# Patient Record
Sex: Female | Born: 1955 | Race: White | Hispanic: No | Marital: Married
Health system: Southern US, Community
[De-identification: ages and names within clinical notes are randomized; demographics above are authoritative.]

---

## 2003-04-13 ENCOUNTER — Encounter: Payer: Self-pay | Admitting: Neurosurgery

## 2003-04-13 ENCOUNTER — Inpatient Hospital Stay (HOSPITAL_COMMUNITY): Admission: RE | Admit: 2003-04-13 | Discharge: 2003-04-14 | Payer: Self-pay | Admitting: Neurosurgery

## 2006-09-15 ENCOUNTER — Encounter: Admission: RE | Admit: 2006-09-15 | Discharge: 2006-09-15 | Payer: Self-pay | Admitting: Neurosurgery

## 2006-10-20 ENCOUNTER — Encounter: Admission: RE | Admit: 2006-10-20 | Discharge: 2006-10-20 | Payer: Self-pay | Admitting: Neurosurgery

## 2007-01-24 ENCOUNTER — Encounter: Admission: RE | Admit: 2007-01-24 | Discharge: 2007-01-24 | Payer: Self-pay | Admitting: Neurosurgery

## 2007-10-03 ENCOUNTER — Ambulatory Visit: Payer: Self-pay | Admitting: Physical Medicine & Rehabilitation

## 2007-10-03 ENCOUNTER — Encounter
Admission: RE | Admit: 2007-10-03 | Discharge: 2007-10-03 | Payer: Self-pay | Admitting: Physical Medicine & Rehabilitation

## 2007-12-07 ENCOUNTER — Ambulatory Visit: Payer: Self-pay | Admitting: Physical Medicine & Rehabilitation

## 2007-12-08 ENCOUNTER — Encounter
Admission: RE | Admit: 2007-12-08 | Discharge: 2007-12-19 | Payer: Self-pay | Admitting: Physical Medicine & Rehabilitation

## 2008-02-06 ENCOUNTER — Encounter: Admission: RE | Admit: 2008-02-06 | Discharge: 2008-02-06 | Payer: Self-pay | Admitting: Anesthesiology

## 2008-02-07 ENCOUNTER — Ambulatory Visit: Payer: Self-pay | Admitting: Anesthesiology

## 2008-06-19 ENCOUNTER — Encounter: Admission: RE | Admit: 2008-06-19 | Discharge: 2008-06-19 | Payer: Self-pay | Admitting: Anesthesiology

## 2008-06-19 ENCOUNTER — Ambulatory Visit: Payer: Self-pay | Admitting: Anesthesiology

## 2008-10-08 ENCOUNTER — Encounter
Admission: RE | Admit: 2008-10-08 | Discharge: 2009-01-06 | Payer: Self-pay | Admitting: Physical Medicine & Rehabilitation

## 2008-10-09 ENCOUNTER — Ambulatory Visit: Payer: Self-pay | Admitting: Physical Medicine & Rehabilitation

## 2008-11-06 ENCOUNTER — Ambulatory Visit: Payer: Self-pay | Admitting: Physical Medicine & Rehabilitation

## 2008-11-20 ENCOUNTER — Ambulatory Visit: Payer: Self-pay | Admitting: Physical Medicine & Rehabilitation

## 2011-05-12 NOTE — Procedures (Signed)
NAME:  Darlene Stone, ACOFF             ACCOUNT NO.:  [id number]   MEDICAL RECORD NO.:  [id number]           PATIENT TYPE:   LOCATION:                                 FACILITY:   PHYSICIAN:  Celene Kras, MD        DATE OF BIRTH:  02-22-1956   DATE OF PROCEDURE:  DATE OF DISCHARGE:                               OPERATIVE REPORT   Misbah [facility].  I evaluated her.  Filled out the history form and 14-point review of systems.  1. Known to me, spondylitic pain, right and left side.  Response times      3+ weeks from last intervention in February.  2. Other modifiable features in health profile discussed.  She      continues to work, but was just laid off.  3. I will go ahead and proceed with cervical facet medial branch      intervention and have her follow up with Dr. Wynn Banker.  We may      consider a second sequential intervention, with considerations of      RF as an option.  The February intervention was followed by a      number of weeks of relief cycling, which is important to understand      the spondylitic pain with added biomechanical stress above and      below surgical fixation site, as diagnostic and therapeutic.  I do      think she had a positive response.   Objectively, diffuse paracervical myofascial discomfort, with positive  cervical decompression test, right and left suprascapular and levator  scapular pain..  Nothing new neurologically.  This is her typical pain,  spondylitic.   IMPRESSION:  Spondylosis, minimal myelopathy.   PLAN:  Cervical facet medial branch intervention C4, C5, C6 and C7,  contributory innervation addressed, right and left side under local  anesthetic.  Predicated further intervention based on need and overall  response.  Questions were answered and discussed in lay terms.  We are  blocking 4 levels to affect 3 with contributory elements at level 4.  She is consented for today's procedure, assessment in the  context of  activities of daily living.  We will follow up with her.   She is consented.   The patient was taken to the operative suite and placed in supine  position after prep and drape in the usual fashion.  Using a 25-gauge  spinal needle, I advanced to the cervical facet at the medial branch C4,  C5, C6, and C7, contributory innervation addressed, right and left side,  under local anesthetic.  Confirmed placement, injected 0.5 mL lidocaine  1% MPF at each level with a total 40 mg Aristocort in divided doses.   She tolerated the procedure well.  No complications from our procedure.  Appropriate recovery.  Discharge instructions given.  She will follow  up.          ______________________________  Celene Kras, MD    HH/MEDQ  D:  06/19/2008 11:06:42  T:  06/20/2008 02:49:49  Job:  [id number]

## 2011-05-12 NOTE — Procedures (Signed)
NAMESHENICA, HOLZHEIMER             ACCOUNT NO.:  [id number]   MEDICAL RECORD NO.:  [id number]          PATIENT TYPE:  REC   LOCATION:  [facility]                          FACILITY:  [facility]   PHYSICIAN:  Celene Kras, MD        DATE OF BIRTH:  06/13/1956   DATE OF PROCEDURE:  DATE OF DISCHARGE:                               OPERATIVE REPORT   [facility].  I have  evaluated her history form and 14 point review of systems.  I had a  conversation about her with Dr. Larna Daughters.  We are going to go on to the  lower cervical segment facet medial branch intervention to attenuate  symptoms described as myofascial, mechanical, with decreased range of  motion, quality of life indices, restorative sleep capacity.  I have  reviewed this in lay terms and I have used models, I discussed treatment  limitations and options, sequential intervention in 2-3 weeks, consider  RF with strong positive outcome predictive element.   1. Maintain contact with Dr. Larna Daughters.   1. We will follow her expectantly.  C6-C7, C8-T1, right and left side      independent needle access points.  We plan to follow along with her      as she has added biomechanical stress described as above and below      surgical fixation site.  Consider the upper segments and predicate      based on the overall response, examine historical features suggest      a mechanical pain.   Objectively, diffuse paracervical suprascapular myofascial, positive  cervical fetal compression test right left.  Suboccipital compression  test positive. Range of motion impaired secondary to pain.  Nothing new  neurologically.  Typical pain mechanical diskogenic.  Spondylosis  without myelopathy.  She is consented for today's procedure.   The patient was taken to the fluoroscopy suite and placed in the supine  position.  Her neck was prepped and draped in the usual fashion. Using a  22 gauge needle, I advanced to the  cervical facet medial branch, C6, C7,  T1/8, right left side independent needle access points under local  anesthetic.  Confirmed placement.  I then inject 0.5 mL lidocaine 1% MPF  at each level and a total of 40 mg Aristocort in divided dose.   She tolerated the procedure well.  No complications from our procedure.  Appropriate recovery.  Assess within the context of activities of daily  living. Will see her in follow-up.           ______________________________  Celene Kras, MD     HH/MEDQ  D:  02/07/2008 13:21:36  T:  02/09/2008 01:56:36  Job:  [id number]

## 2011-05-12 NOTE — Group Therapy Note (Signed)
INDICATION:  Neck and shoulder pain bilaterally.   HISTORY:  The patient with a work-related injury, date of injury  August 28, 2002.  She was initially treated by orthopedics, Dr.  Jene Every.  She was sent for a neurosurgical consultation on February 26, 2003.  She complains of weakness in her arms and shoulder, especially  with increased usage.  She has been able to work at her job after  undergoing C5-6 laminectomy and fusion, which was performed by Dr.  Jeral Fruit on April 13, 2003.  She has had the cervical myelogram repeated postoperatively and the last  one was done on June 14, 2007, showing a small C3-4 disk moderate  compression dural sack, fusion intact C5-6.  The patient has not had MMI  established at this point.  She is sent to have other options discussed  with her.  She has seen Dr. Jeral Fruit post CT myelogram and he felt that  there was no surgical lesion.  The patient complains of pain in the neck and shoulder area.  She states  that her shoulders get numb at night.  She is wondering whether she  actually has a problem in the shoulders or if this is all coming from  her neck.  Her pain currently is 3 out of 10 but gets up to 9 out of 10.  She uses basically over-the-counter analgesics before work to help her  get through the day.  Heat does help as well.  She can walk an hour at a  time.  She climbs steps.  She drives.  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  She has some tingling in the arms, spasms, night  sweats.   SOCIAL HISTORY:  She is married.   FAMILY HISTORY:  Heart disease, diabetes, high blood pressure.   PAST SURGICAL HISTORY:  1. Hysterectomy.  2. Carpal tunnel release in 2001.  3. Gallbladder surgery in 2005.  4. Toe surgery in 1987.   PHYSICAL EXAMINATION:  VITAL SIGNS:  Blood pressure 125/53, pulse 75,  respirations 18, O2 sat 98% on room air.  GENERAL:  No acute distress.  Mood and affect appropriate.  MUSCULOSKELETAL:  She has tenderness  bilateral upper trapezius as well  as upper medial scapular border, left greater than right.  Mild  tightness in the scalene but no excessive tone.  NECK:  Range of motion is good at about 75% in all planes.  EXTREMITIES:  Her upper and lower extremities strength appears normal.  She has no numbness in the upper or lower extremities in C5, C6, C7, C8,  T1, L2, L3, L4,  L5 dermatomes.  Gait is normal.  Her shoulder range of  motion is normal.  Impingement sign is negative.  AC joint testing is  negative.   IMPRESSION:  1. Cervical post laminectomy syndrome.  Her pain is likely multi-      factorial but may have various components.  These include:      a.     Radicular, she may have C5 radicular symptoms, this can be       further assessed by EMG.      b.     Cervical facet syndrome, particularly above and below the       level of the fusion.  This can be further assessed with cervical       median branch blocks.      c.     Myofascial pain, trapezius and levator scapulae  muscle       groups.  We will do trigger point injections today.   PLAN:  As noted above.   Thank you for this interesting consultation.   ADDENDUM:  Trigger point injections were performed to the left trapezius  and levator scapulae areas, marked and prepped, and through a 25-gaugue  inch and a half needle 1mL of lidocaine injected to each site.  The  patient tolerated the procedure well.      Erick Colace, M.D.  Electronically Signed     AEK/MedQ  D:  10/03/2007 16:50:02  T:  10/03/2007 22:24:53  Job #:  [id number]   cc:   Hilda Lias, M.D.  Fax: [phone]   Leretha Dykes  FAX:  [phone]   Madaline Guthrie, Ms.  [facility]  [address] [address]  [place], [place] [address]  FAX:  [phone]

## 2011-05-12 NOTE — Assessment & Plan Note (Signed)
Date of injury is August 28, 2002.   A 55 year old female who I saw in initial evaluation approximately 1  year ago.  She has undergone C5-C6 laminectomy and fusion per Dr. Jeral Fruit  on April 12, 2008.  She had a cervical myelogram postoperatively on June 14, 2007, showing a small C3-C4 disk, moderate compression of the dural  sac, and intact fusion at C3-C4 disk.   After my initial evaluation, I performed trigger point injection, which  was not helpful for her pain.  She was seen by Dr. Stevphen Rochester for cervical  medial branch blocks with some partial pain relief.  These were done at  the C4, C5, C6, and C7 level.  She did not respond for the second set of  medial branch blocks.  In the past, she also has trialed interlaminar C7-  T1 surgical epidural steroid injections.  She has had upper extremity  nerve conduction studies, which were normal.   On my last visit, I examined her.  She had no neurologic deficits.  She  only had 2/18 fibromyalgia tender points positive.   I put her back to 20-pound maximum lifting and no overhead lifting until  she can get FCE schedule.   In the interval time, she has undergone FCE at [facility] on October 30, 2008.  She is back to know her results.   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  She is  working in 12-hour shift currently.  Recommended overhead work  occasional.  Also, recommendations to limit crawling, limit kneeling,  and limit prolonged crutching.  Overall, I have reviewed the FCE and  agreed with the results.   Pain score currently 5/10.  Sleep is fair.  Pain improves with rest.   REVIEW OF SYSTEMS:  Positive for weakness and the upper extremity spasms  and night sweats.   INTERVAL MEDICAL HISTORY:  Seen by Neurology, Dr. Clarisse Gouge.   PHYSICAL EXAMINATION:  VITAL SIGNS:  Blood pressure 130/74, pulse 81,  respiratory rate 18,  and O2 sat 100% on room air.  GENERAL:  In no acute distress.  Orientation x3.  Affect is alert.  Gait  is normal.  EXTREMITIES:  Without edema.  Coordination is normal.  Upper and lower  extremity sensation is normal.  Neck range of motion is 50% forward  flexion, extension, lateral rotation, and bending.   Palpation of fibromyalgia tender points 0/18 positive.   IMPRESSION:  Cervical post-laminectomy syndrome, chronic postoperative  pain.  I believe that some of her pain is myofascial in nature.   PLAN:  I think, she has reached maximum medical improvement.  I agree  with FCE results.  I will release her from my care.  No medications have  been prescribed.  She can use over-the-counter medications on a p.r.n.  basis.   I do not see any physical exam findings of fibromyalgia syndrome at this  time.  We would also doubt causality in terms of work injury causing  fibromyalgia.   This was discussed with the patient.  She agrees with the above plan.      Erick Colace, M.D.  Electronically Signed     AEK/MedQ  D:  11/06/2008 12:17:09  T:  11/07/2008 00:25:31  Job #:  [id number]   cc:   Edd Arbour, M.D.  Fax: [phone]   Rene Kocher, M.D.  Fax: [phone]  Shade Flood, M.D.  Fax: [phone]

## 2011-05-12 NOTE — Assessment & Plan Note (Signed)
Ms. Darlene Stone is a 55 year old female who I saw in initial evaluation  approximately 1 year ago.  She has a date of injury of August 28, 2002.   She has had weakness in her arms and shoulder especially with increased  usage.  She has undergone C5-C6 laminectomy and fusion per Dr. Jeral Fruit on  April 13, 2003.  She has had a cervical myelogram repeated  postoperatively on June 14, 2007 showing a small T3-T4 disk, moderate  compression dural sac, and intact fusion at C5-C6.   Her past medical history is significant for hysterectomy, carpal tunnel  release, gallbladder surgery, and toe surgery.   After my initial evaluation, I performed trigger point injection, which  was not particularly helpful for her pain.  Therefore, she was set up  with Dr. Stevphen Rochester for medial branch blocks.  The first set was performed  on February 07, 2008, which she states improved her pain from 5 to a  3/10 post injection, in fact did a little bit better than that; however,  then she did not follow up promptly and then she blamed Workers' Comp  for not getting approval and repeated the cervical medial branch blocks  on July 19, 2008 at T4, T5, T6, and T7 levels once again, but at this  time did not respond as well per her report.   Other interventions trialed in the past include left translaminar at C7-  T1 and a right T6-T7 paramedian translaminar as well as a left C6-C7  paramedian translaminar done prior to October 2008.   Because of upper extremity weakness complaints, I did perform bilateral  upper extremity EMG/NCV, which were normal.   She had been working light duty, but a couple months ago, complained of  arm pain, saw her primary care physician, who took her off work, had a  cervical myelogram, and this once again demonstrated C3-C4 disk.  She  was seen in reevaluation per Dr. Jeral Fruit from Neurosurgery, who ordered a  repeat cervical myelogram performed on August 27, 2008, which showed C3-  C4 disk,  but read as a smaller displacement.   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   She states that she also has an appointment to see Dr. Clarisse Gouge from  Neurology to see whether she has fibromyalgia.   Her average pain is 7/10; current pain is 6/10; described as sharp,  stabbing, aching; and interferes with activity at a moderate level.  We  did perform a Oswestry disability index today, which was 12, indicating  minimal disability.   REVIEW OF SYSTEMS:  Positive for weakness in her arms and spasms.   SOCIAL HISTORY:  Married.   FAMILY HISTORY:  Heart disease, diabetes, and high blood pressure.   PHYSICAL EXAMINATION:  VITAL SIGNS:  Blood pressure 147/90, pulse 81,  respiratory rate 18, and O2 sats 100% on room air.  GENERAL:  Well-developed, well-nourished female.  Orientation x3.  Affect is alert.  Mildly anxious.  Gait is normal.  EXTREMITIES:  Without edema.  She has normal coordination upper and  lower extremity.  Deep tendon reflexes 3+ bilateral triceps and  brachioradialis, otherwise 2+ in biceps, knees, and ankles.  She has 5/5  strength bilateral deltoid, biceps, triceps grip, hip flexion, knee  extension, and ankle dorsiflexion.  Shoulder range of motion is normal.  Impingement sign is negative.  Neck range of motion is 50% range forward  flexion, extension, lateral rotation,  and bending.  She points to her  left sternocleidomastoid area that she noticed some swelling.  Currently, she does not have any swelling, no fullness, no tone  abnormalities.   She is able to toe walk and heel walk.   IMPRESSION:  Cervical post-laminectomy syndrome.  She has chronic  postoperative pain.  Followup CT myelogram shows no compressive lesions.  EMG shows no evidence of innervation in the upper extremities.  She has  had multiple interventions including injections as well as a trial of  her medications.  She has no objective weakness and has  been able to  function light duty prior to be taken off her job more recently.   Overall, I think she is at maximal medical improvement, and at this  point, would need FCE to establish permanent restrictions.  I think  until FCE is performed, she could safely go back to work at a light duty  level of 20-pound max lifting, no overhead lifting.  She will see me  back after the FCE.   She already has an appointment approved by Workers' Comp with Neurology.  I certainly do not think she has fibromyalgia and that is a question to  be answered.  She really has only 2 tender points rather than required,  11/18.   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  I do not see it  currently, and it would be also questionable whether this was related to  her work injury would be interested in Neurology depending on this.      Erick Colace, M.D.  Electronically Signed     AEK/MedQ  D:  10/09/2008 15:24:35  T:  10/10/2008 06:13:20  Job #:  [id number]   cc:   Hilda Lias, M.D.  Fax: [phone]   Shade Flood, M.D.  Fax: [phone]   Dr. Lucila Maine

## 2011-05-12 NOTE — Assessment & Plan Note (Signed)
HISTORY OF PRESENT ILLNESS:  A 55 year old female I saw in initial  evaluation one year ago.  I last saw her on November 06, 2008.  There is  some clarification requested based on FCE in my last report from  November 07, 2008, and therefore another visit was scheduled by her  insurance company.  She has a history of C5-C6 laminectomy and fusion  per Dr. Jeral Fruit on April 12, 2008.  Cervical myelogram postoperatively on  June 13, 2008, showed a small C3-C4 disk moderate compression of dural  sac.  She has had cervical medial branch blocks per Dr. Stevphen Rochester with  partial pain relief with first set of blocks, but not with the second  set of blocks.  She had C7-T1 cervical epidural steroid injections,  which were not particularly helpful.  She had upper extremity nerve  conduction studies bilaterally, which were normal.  She had an FCE at  [facility] on October 30, 2008.  This was an  extensive evaluation.  I have reviewed the results and agree, and will  use these results to write my recommended restrictions.  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   Based on the problems with her neck, the main concern is waist-to-  overhead lift.  She has been cleared by FCE to do occasional 20-pound  waist-to-overhead lift, which I agree with.  I did review her work  duties.  Her lift is for 15-pound, occasional 12-inch to 50-inch lift,  which is well within her tested abilities.  Further details may be  obtained from the FCE form.  The patient tolerates 12-hour work shifts  and has been working them currently.  Recommendations to limit crawling,  limit kneeling, and limit prolonged crouching.   PHYSICAL EXAMINATION:  GENERAL:  In no acute distress.  Orientation x3.  Affect is alert.  Gait is normal.  EXTREMITIES:  Without edema.  Coordination normal.  Upper and lower  extremity sensation normal.  Neck range of motion 50% forward flexion,  extension,  lateral rotation, and bending.  She has tenderness along the  cervical paraspinals.  Palpation of fibromyalgia tender points is 2/18,  which is less than the 11/18 needed to make that diagnosis.   IMPRESSION:  Cervical post-laminectomy pain syndrome, chronic  postoperative pain, cervicalgia, and cervical degenerative disk.   I think she has reached maximum medical improvement, I think she reached  that when I last saw her.  I would release her with the above  restrictions.  I have discussed this with both the patient's case  manager and the patient.  If there are any other further questions, I  can be contacted.   I did explain to the patient that I do not think that she has  fibromyalgia syndrome based on diagnostic criteria.  I will not schedule  her back.  Her Oswestry Disability Index score is 18%, putting her at  the minimal disability level.      Erick Colace, M.D.  Electronically Signed     AEK/MedQ  D:  11/20/2008 17:45:15  T:  11/21/2008 05:06:20  Job #:  [id number]   cc:   Milon Score  Adrian  Fax [phone]   Hilda Lias, M.D.  Fax: [phone]

## 2011-05-12 NOTE — Assessment & Plan Note (Signed)
Darlene Stone returns today to discuss the EEG results.  Also to talk  about general treatment plans.  She continues to have shoulder pain,  bilateral, as well as neck pain.  No hand numbness or tingling  currently.  She has some shoulder numbness at night.   Pain level 6/10.  She is taking over-the-counter analgesics, really not  too interested in taking anything stronger, given that she is on other  medications for hypercholesterolemia, hypertension, and GERD.  Her pain  is rated at a 4/10 level which is fair.  Pain improves with rest and  heat.  She can walk an our at a time.  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   REVIEW OF SYSTEMS:  Positive for tingling muscle spasms.   PAST MEDICAL HISTORY:  As noted above.  She also has a history of carpal  tunnel syndrome, cholecystectomy, hysterectomy, as well as her C5-6 neck  fusion.   Other treatments prior, she has had a left translaminar C7-T1 cervical  epidural steroid injection.  She has had a right C6-7 paramedian  translaminar cervical epidural steroid injection and a left C6-7  translaminar cervical epidural steroid injection.   She has not had any cervical facet injections.   The epidural injections last about a week.   PHYSICAL EXAMINATION:  VITAL SIGNS:  Blood pressure 121/69.  Pulse 58.  Respirations 20.  O2 sat 99% on room air.  GENERAL:  In no acute distress.  She has 5/5 strength in bilateral  deltoid by triceps grip.  She has intact sensation at C5, 6, 7, 8  dermatomes.  She has 3+ reflexes followed by triceps and brachial  radialis.  Shoulder range of motion is normal and impingement sign is  negative.   IMPRESSION:  Cervical post laminectomy syndrome.  She may have cervical  facet syndrome and she appears to have some myofascial pain syndrome as  well.  She has no electric diagnostic evidence of peripheral nerve  compression or cervical radiculopathy.   PLAN:  Will send for cervical head  injections.      Erick Colace, M.D.  Electronically Signed     AEK/MedQ  D:  12/19/2007 16:05:42  T:  12/19/2007 23:11:31  Job #:  [id number]   cc:   Hilda Lias, M.D.  Fax: [phone]

## 2011-05-15 NOTE — Op Note (Signed)
   NAME:  Darlene Stone, Darlene Stone                       ACCOUNT NO.:  [id number]   MEDICAL RECORD NO.:  [id number]                   PATIENT TYPE:  INP   LOCATION:  2899                                 FACILITY:  [facility]   PHYSICIAN:  Hilda Lias, M.D.                DATE OF BIRTH:  February 26, 1956   DATE OF PROCEDURE:  04/13/2003  DATE OF DISCHARGE:                                 OPERATIVE REPORT   PREOPERATIVE DIAGNOSIS:  Cervical spondylosis with chronic left C6  radiculopathy.   POSTOPERATIVE DIAGNOSIS:  Cervical spondylosis with chronic left C6  radiculopathy.   OPERATION PERFORMED:  Anterior C5-6 diskectomy decompressing the spinal  cord, bilateral foraminotomy, removal of fragment on the left side.  Interbody fusion with iliac crest bone graft followed by a plate from C5 to  C6.  Microscope.   SURGEON:  Hilda Lias, M.D.   ASSISTANT:  Coletta Memos, M.D.   ANESTHESIA:   INDICATIONS FOR PROCEDURE:  The patient was admitted because of chronic neck  and left upper extremity pain.  X-ray showed spondylosis at the level of 5-  6.  Surgery was advised and the risks were explained in the history and  physical.   DESCRIPTION OF PROCEDURE:  The patient was taken to the operating room and  after intubation, the left side of the neck was prepped with Betadine.  Transverse incision was made through the skin, platysma, down to the  cervical spine.  The x-ray showed we were at the level of 4-5.  From then on  we identified 5-6.  There was a large osteophyte which was removed.  We  opened the anterior ligament  which was partially calcified.  We did a total  gross diskectomy.  We brought the microscope into the area and with the  drill we drilled the end plate of 5 and 6. We opened the posterior ligament  and indeed there was quite a bit of stenosis in the right side, but in the  left side no __________. Decompression of both C6 nerve roots as well as the  spinal cord was achieved.  After  that, iliac crest bone graft of 8 mm was  inserted.  This was followed by a plate using four screws.  Lateral C-spine  showed good position of the graft and the plate.  From then on the area was  investigated.  The carotid __________ and jugular were normal.  Irrigation  was done.  The wound was closed with Vicryl and Steri-Strip.                                                Hilda Lias, M.D.    EB/MEDQ  D:  04/13/2003  T:  04/13/2003  Job:  [id number]

## 2011-05-15 NOTE — H&P (Signed)
   NAME:  Darlene Stone, Darlene Stone                       ACCOUNT NO.:  [id number]   MEDICAL RECORD NO.:  [id number]                   PATIENT TYPE:  INP   LOCATION:  3006                                 FACILITY:  [facility]   PHYSICIAN:  Hilda Lias, M.D.                DATE OF BIRTH:  1956-11-16   DATE OF ADMISSION:  04/13/2003  DATE OF DISCHARGE:                                HISTORY & PHYSICAL   HISTORY OF PRESENT ILLNESS:  Darlene Stone is a lady who came to see [place] about  six weeks ago because of neck and left arm pain. This patient problems  started back in 8/03 when suddenly she developed neck pain with radiation  down to the left arm. This happened when she was pulling some cables. She  denies any problems with the right arm. The patient has had conservative  treatment with no improvement. Then, she had MRI and because of the findings  she was sent to [place].   PAST MEDICAL HISTORY:  Hysterectomy, gum surgery.   ALLERGIES:  The patient is allergic to CODEINE.   SOCIAL HISTORY:  Negative.   FAMILY HISTORY:  Mother is 56 years old with high blood pressure.   REVIEW OF SYSTEMS:  Positive for neck and left arm pain.   PHYSICAL EXAMINATION:  HEAD, EYES, EARS, NOSE AND THROAT:  Normal.  NECK:  There is some tenderness in the trapezius muscle. She is able to  flex, but extension or __________  produces pain.  LUNGS:  Clear.  HEART:  Sounds normal.  EXTREMITIES:  Normal pulses.  NEUROLOGICAL:  Mental status normal. Cranial nerves normal. Strength:  I  bend easily both biceps and both wrist extensors. The rest of the muscles in  the upper and lower extremities are normal. Sensation is normal. She  complains of tingling sensation that goes mostly to the thumb or index  finger. Reflexes normal. No Babinski.   RADIOLOGIC FINDINGS:  The MRI showed that she has degenerative disk disease  with stenosis bilaterally at the level of 5-6, left worse than right one.   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   RECOMMENDATIONS:  The patient wants to proceed with surgery. The patient  will be an anterior cervical diskectomy at the level of 5-6 followed by bone  graft and plate. She knows about the risks such as infection, CSF leak,  worsening of pain, paralysis, need for further surgery.                                               Hilda Lias, M.D.    EB/MEDQ  D:  04/13/2003  T:  04/13/2003  Job:  [id number]

## 2014-08-31 ENCOUNTER — Other Ambulatory Visit: Payer: Self-pay | Admitting: Neurosurgery

## 2014-08-31 DIAGNOSIS — M5412 Radiculopathy, cervical region: Secondary | ICD-10-CM

## 2014-09-13 ENCOUNTER — Other Ambulatory Visit: Payer: Self-pay | Admitting: Neurosurgery

## 2014-09-13 ENCOUNTER — Ambulatory Visit
Admission: RE | Admit: 2014-09-13 | Discharge: 2014-09-13 | Disposition: A | Discharge status: Home / Self Care | Payer: PRIVATE HEALTH INSURANCE | Source: Ambulatory Visit | Attending: Neurosurgery | Admitting: Neurosurgery

## 2014-09-13 DIAGNOSIS — M5412 Radiculopathy, cervical region: Secondary | ICD-10-CM

## 2014-09-13 MED ORDER — DIAZEPAM 5 MG PO TABS
10.0000 mg | ORAL_TABLET | Freq: Once | ORAL | Status: AC
  Administered 2014-09-13: 5 mg via ORAL

## 2014-09-13 MED ORDER — IOHEXOL 300 MG/ML  SOLN
10.0000 mL | Freq: Once | INTRAMUSCULAR | Status: AC | PRN
  Administered 2014-09-13: 10 mL via INTRATHECAL

## 2014-09-13 NOTE — Progress Notes (Signed)
1610 Discharge instructions explained to pt. Dr. Carlota Raspberry in and explained procedure to pt.

## 2014-09-13 NOTE — Discharge Instructions (Signed)

## 2014-09-21 ENCOUNTER — Ambulatory Visit
Admission: RE | Admit: 2014-09-21 | Discharge: 2014-09-21 | Disposition: A | Discharge status: Home / Self Care | Payer: PRIVATE HEALTH INSURANCE | Source: Ambulatory Visit | Attending: Neurosurgery | Admitting: Neurosurgery

## 2014-09-21 DIAGNOSIS — M5412 Radiculopathy, cervical region: Secondary | ICD-10-CM

## 2014-09-21 MED ORDER — TRIAMCINOLONE ACETONIDE 40 MG/ML IJ SUSP (RADIOLOGY)
60.0000 mg | Freq: Once | INTRAMUSCULAR | Status: AC
  Administered 2014-09-21: 60 mg via EPIDURAL

## 2014-09-21 MED ORDER — IOHEXOL 300 MG/ML  SOLN
1.0000 mL | Freq: Once | INTRAMUSCULAR | Status: AC | PRN
  Administered 2014-09-21: 1 mL via EPIDURAL

## 2014-09-21 NOTE — Discharge Instructions (Signed)

## 2015-12-04 IMAGING — CT CT CERVICAL SPINE W/ CM
4 of 5 series · 14 of 33 positions shown, 16 images · non-contrast
Comparison: none

CLINICAL DATA: Cervical radiculopath. neck pain radiating to the
LEFT upper extremity. Numbness in weakness for several weeks.
Failure of conservative measures. C5-C6 ACDF in the remote past.
TECHNIQUE: Contiguous axial images were obtained through the Cervical spine
after the intrathecal infusion of infusion. Coronal and sagittal
reconstructions were obtained of the axial image sets.

[Series 100: c spine bone · axial · 0.25mm/px · z∈[-159,-107]mm · 2 of 65 slices shown]
[im 22/65  bone]
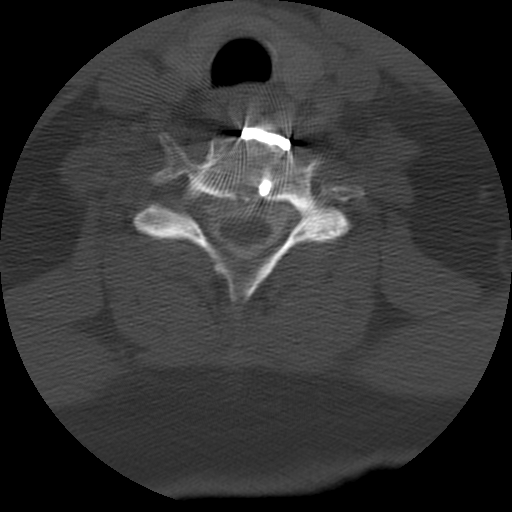
[im 43/65  bone]
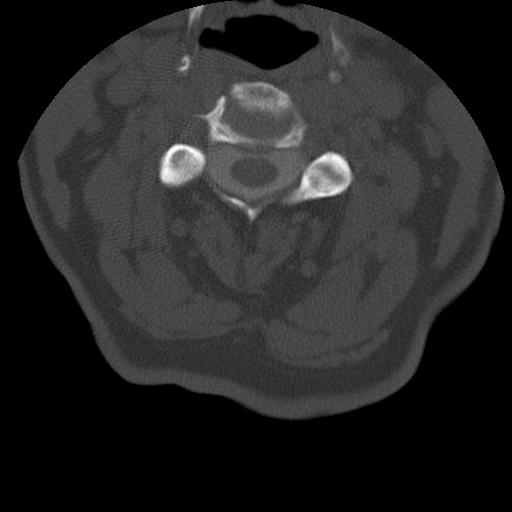

[Series 101: cor · coronal · 0.32mm/px · 3 of 37 slices shown]
[im 8/37  bone]
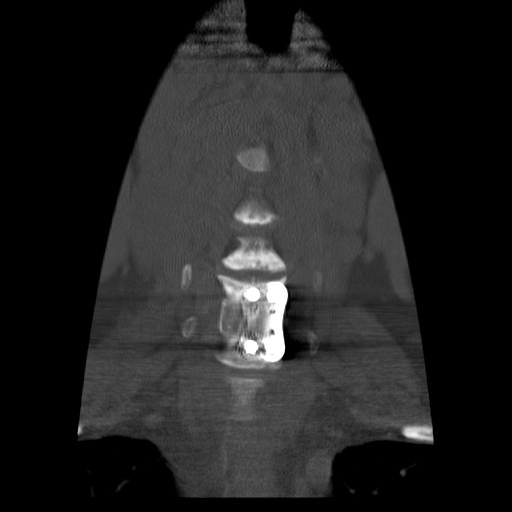
[im 15/37  bone]
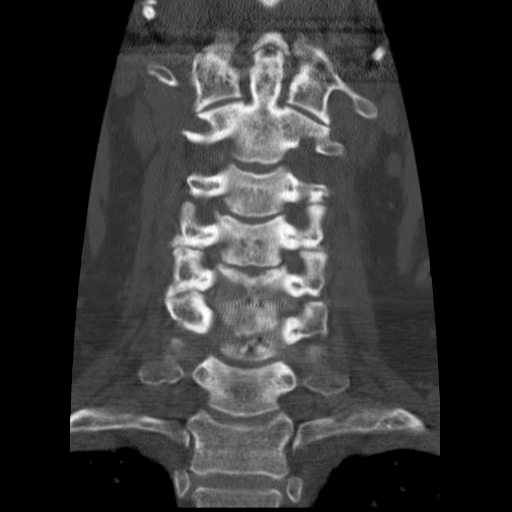
[im 22/37  bone]
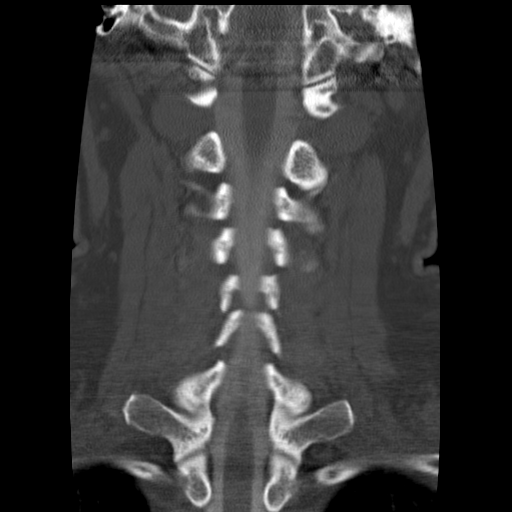

[Series 103: sag · sagittal · 0.32mm/px · 5 of 38 slices shown, 6 images]
[im 13/38  bone]
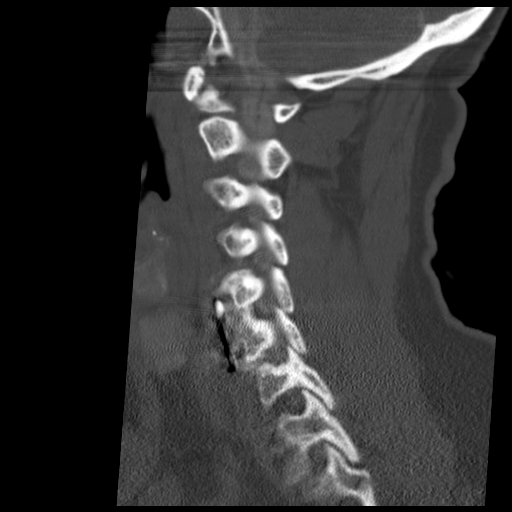
[im 16/38  bone]
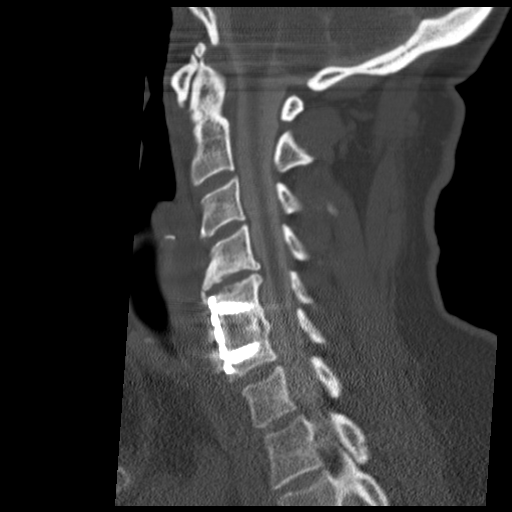
[im 19/38  soft-tissue]
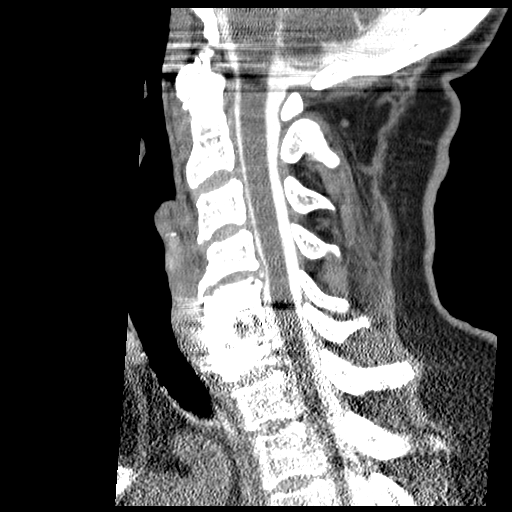
[im 19/38  bone]
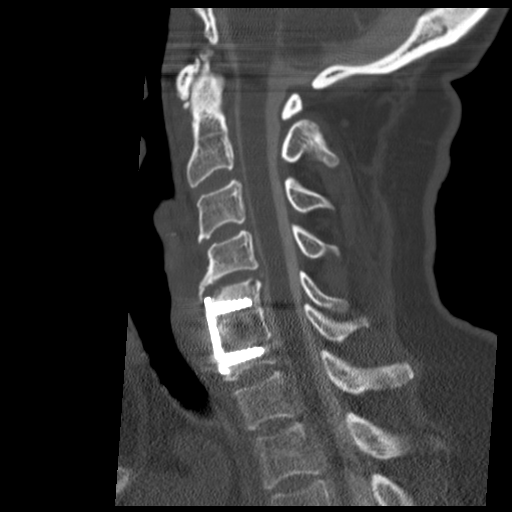
[im 22/38  bone]
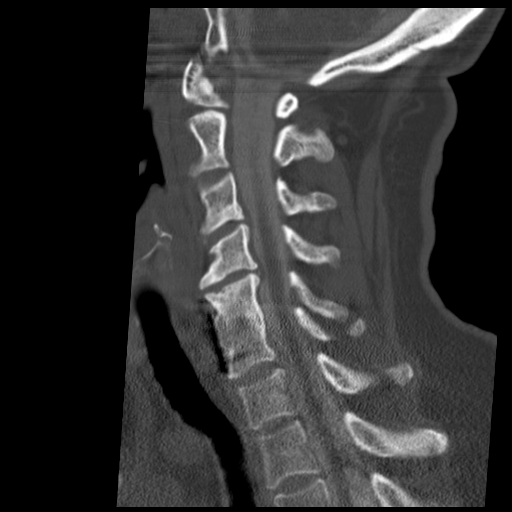
[im 25/38  bone]
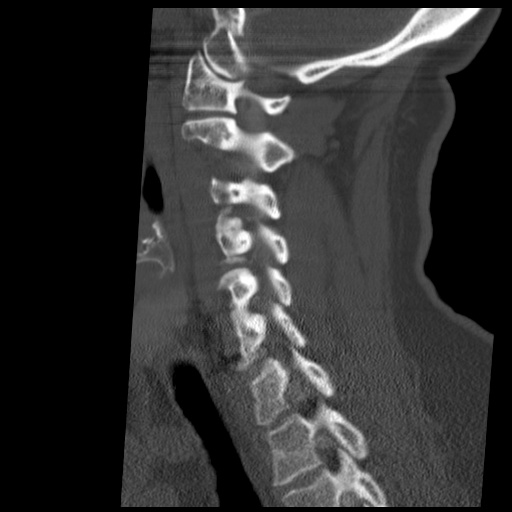

[Series 104: axial · axial · 0.23mm/px · z∈[-204,-101]mm · 4 of 93 slices shown, 5 images]
[im 19/93  soft-tissue]
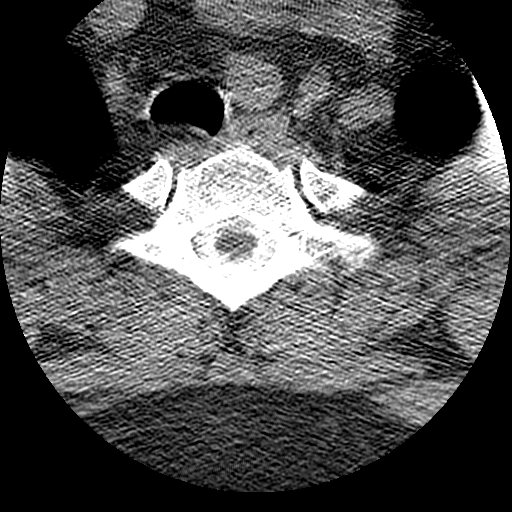
[im 19/93  bone]
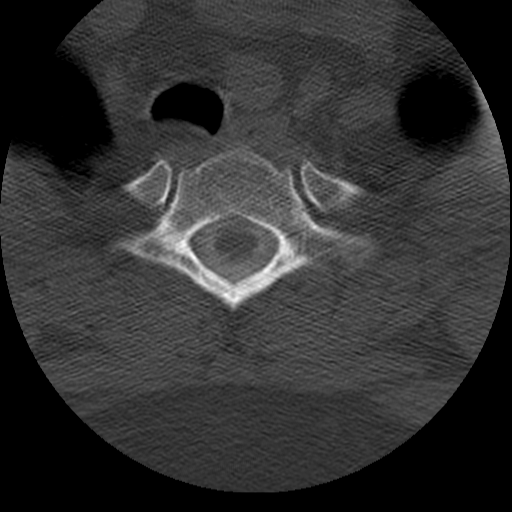
[im 37/93  bone]
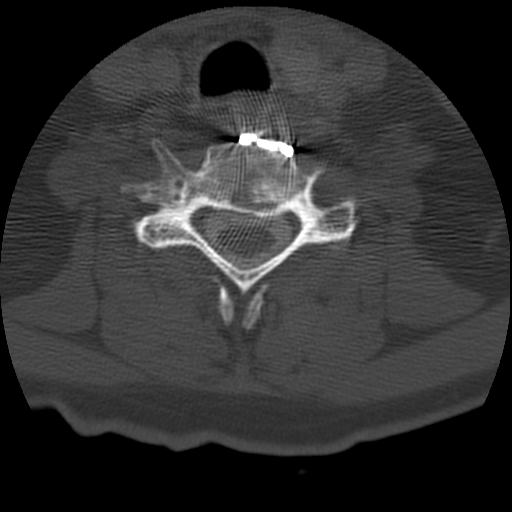
[im 56/93  bone]
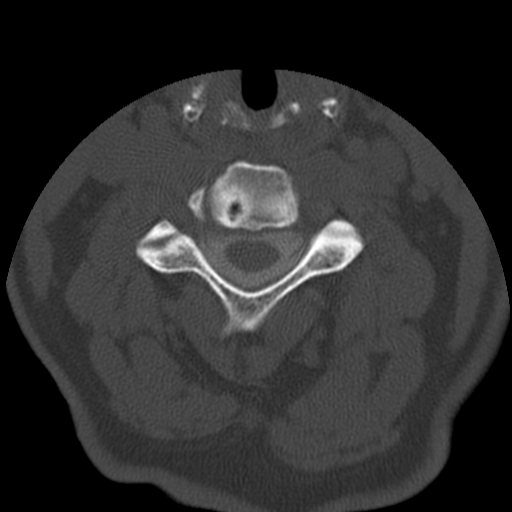
[im 74/93  bone]
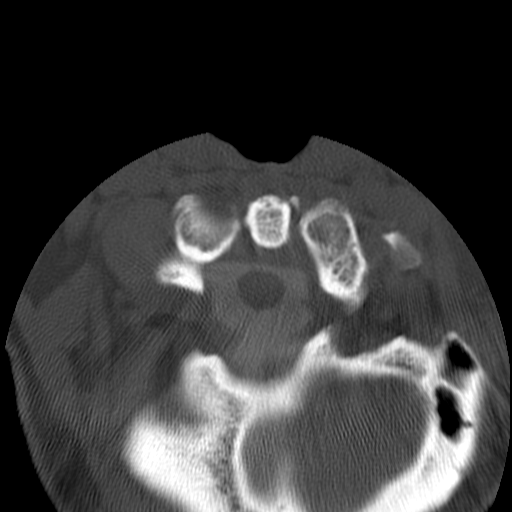

[14 of 33 positions shown; findings below may reference images not displayed]

FLUOROSCOPY TIME:  0 min 59 seconds

PROCEDURE:
LUMBAR PUNCTURE FOR CERVICAL MYELOGRAM

After thorough discussion of risks and benefits of the procedure
including bleeding, infection, injury to nerves, blood vessels,
adjacent structures as well as headache and CSF leak, written and
oral informed consent was obtained. Consent was obtained by Dr.
Levington Evas. We discussed the high likelihood of obtaining a
diagnostic study.

Patient was positioned prone on the fluoroscopy table. Local
anesthesia was provided with 1% lidocaine without epinephrine after
prepped and draped in the usual sterile fashion. Puncture was
performed at L3-L4 using a 3 1/2 inch 22 gauge pencil point Kir[place]oya
spinal needle via RIGHT paramedian approach. Using a single pass
through the dura, the needle was placed within the thecal sac, with
return of clear CSF. 10 mL of 8mnipaque-G44 was injected into the
thecal sac, with normal opacification of the nerve roots and cauda
equina consistent with free flow within the subarachnoid space. The
patient was then moved to the trendelenburg position and contrast
flowed into the Cervical spine region.

I personally performed the lumbar puncture and administered the
intrathecal contrast. I also personally supervised acquisition of
the myelogram images.
FINDINGS: CERVICAL MYELOGRAM FINDINGS:

Plain film myelogram shows solid fusion at C5-C6. Suboptimal
visualization of the lower cervical spine. Adjacent segment disease
is difficult to assess by plain film. There is no pathologic motion
with flexion extension. Range of motion is very limited.

CT CERVICAL MYELOGRAM FINDINGS:

Alignment: Anatomic.

Vertebrae: Degenerative endplate changes with subchondral cyst at
C3-C4.

Cord: Normal caliber.

Posterior Fossa: Grossly normal.

Vertebral Arteries: Grossly normal.

Paraspinal tissues: Lung apices appear within normal limits.

Disc levels:

C2-C3: Negative. Facet joints appear normal. Normal disc. No
stenosis.

C3-C4: Mild disc degeneration with anterior annular calcification.
Mild RIGHT uncovertebral spurring. The LEFT neural foramen appears
patent. Minimal RIGHT foraminal encroachment.

C4-C5: Adjacent segment disease is present with mild central
stenosis. There is a broad-based disc osteophyte complex with tip
effaces the ventral subarachnoid space and contacts the ventral cord
with flattening. There is bilateral uncovertebral spurring with mild
RIGHT-greater-than-LEFT foraminal stenosis.

C5-C6: ACDF.  Solid fusion.  No recurrent stenosis.

C6-C7: Evaluation degraded by artifact at this level. There is a
RIGHT eccentric central disc protrusion. Disc contacts the cord to
the RIGHT of midline and produces mild central stenosis. Neural
foramina appear adequately patent.

C7-T1: Normal appearance of the disc. Evaluation of this level is
degraded by beam attenuation from body habitus. No stenosis.
IMPRESSION: 1. Technically successful lumbar puncture for cervical myelogram.
2. Solid C5-C6 fusion without recurrent stenosis.
3. C4-C5 adjacent segment disease with shallow disc osteophyte
complex producing mild central stenosis with flattening of the cord.
Mild RIGHT foraminal stenosis.
4. C6-C7 evaluation is degraded by artifact from hardware. RIGHT
eccentric disc protrusion producing mild central stenosis.

## 2015-12-04 IMAGING — RF DG MYELOGRAPHY LUMBAR INJ CERVICAL
13 series · 13 of 13 positions shown · non-contrast
Comparison: none

CLINICAL DATA: Cervical radiculopath. neck pain radiating to the
LEFT upper extremity. Numbness in weakness for several weeks.
Failure of conservative measures. C5-C6 ACDF in the remote past.
TECHNIQUE: Contiguous axial images were obtained through the Cervical spine
after the intrathecal infusion of infusion. Coronal and sagittal
reconstructions were obtained of the axial image sets.

[Series 1: (hospital) · 1 of 1 slices shown (1 of 2)]
[im 1/1]
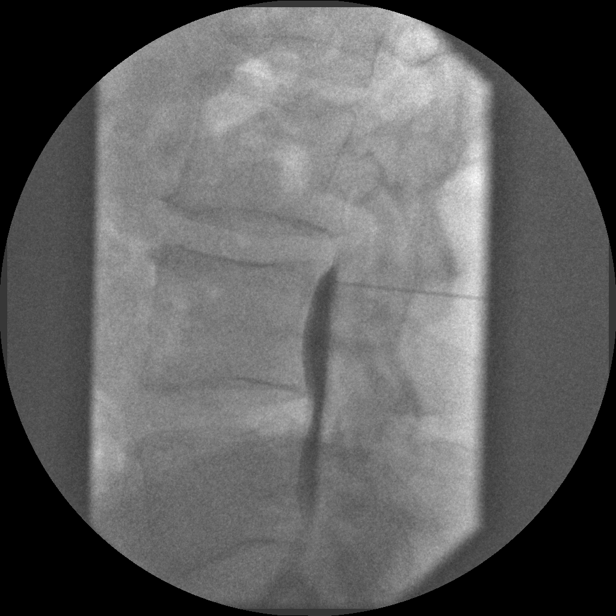

[Series 2: (hospital) · 1 of 1 slices shown (2 of 2)]
[im 1/1]
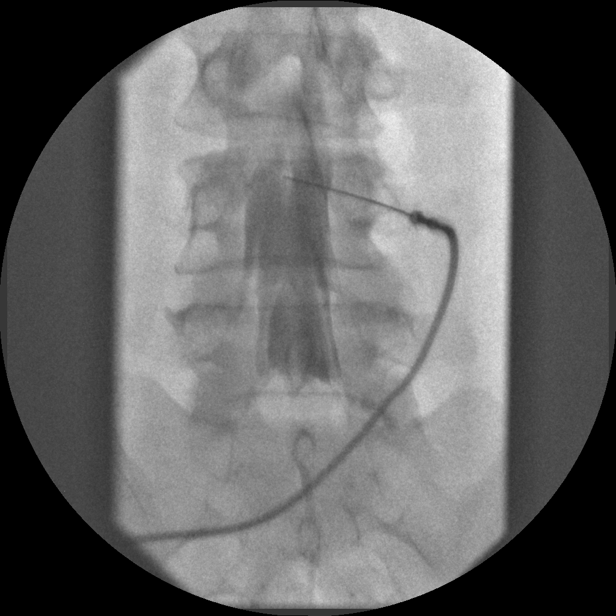

[Series 3: myelogram  white · 1 of 1 slices shown (1 of 8)]
[im 1/1]
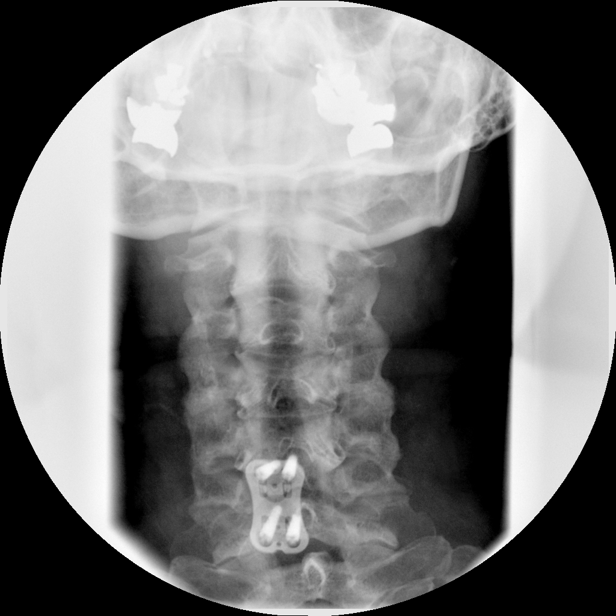

[Series 4: myelogram  white · 1 of 1 slices shown (2 of 8)]
[im 1/1]
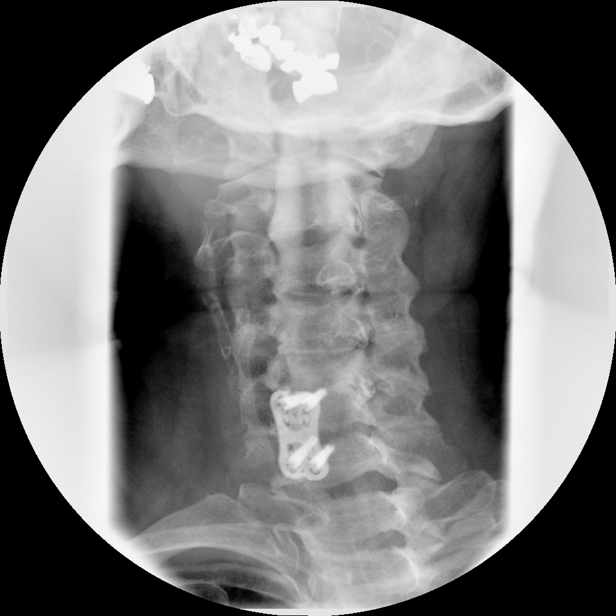

[Series 5: myelogram  white · 1 of 1 slices shown (3 of 8)]
[im 1/1]
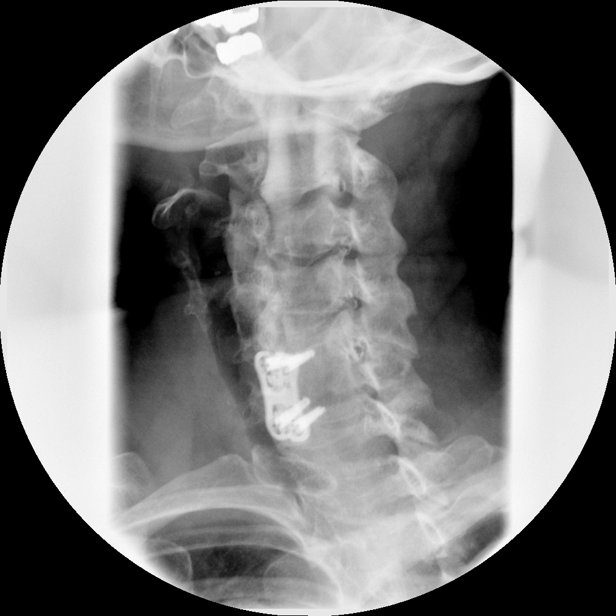

[Series 6: myelogram  white · 1 of 1 slices shown (4 of 8)]
[im 1/1]
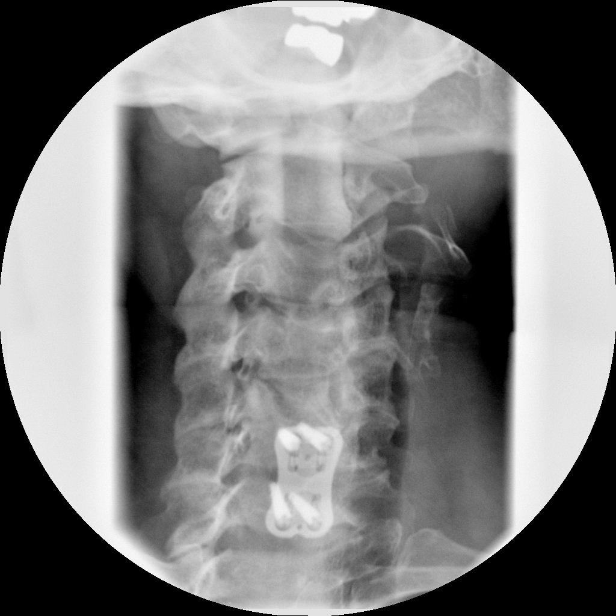

[Series 7: myelogram  white · 1 of 1 slices shown (5 of 8)]
[im 1/1]
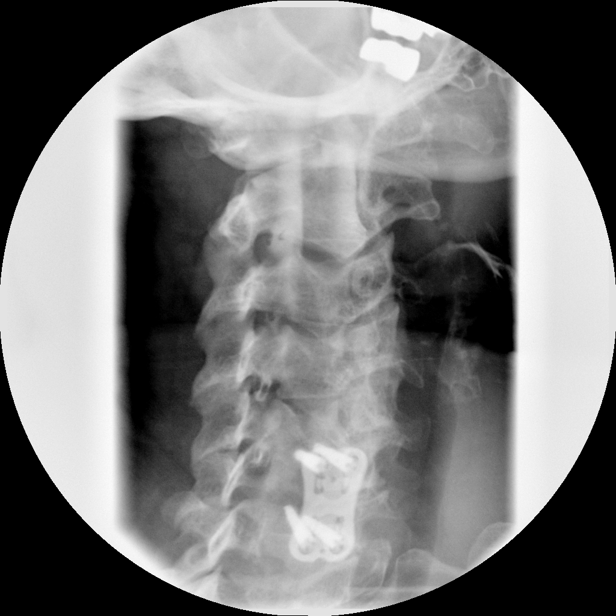

[Series 8: myelogram  white · 1 of 1 slices shown (6 of 8)]
[im 1/1]
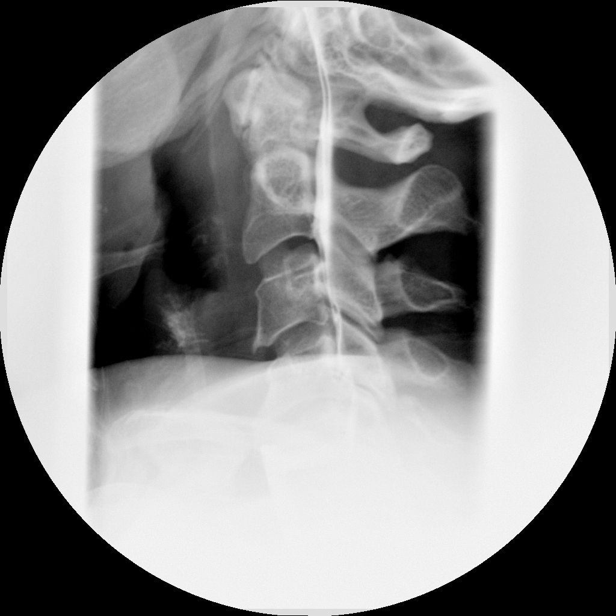

[Series 9: myelogram  white · 1 of 1 slices shown (7 of 8)]
[im 1/1]
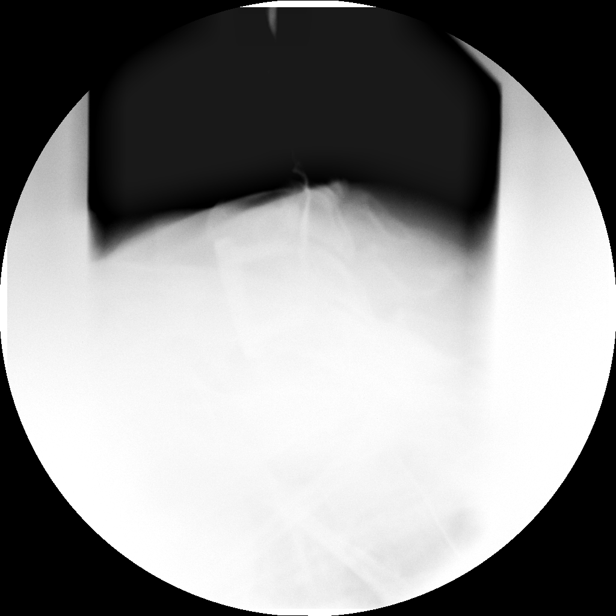

[Series 10: myelogram  white · 1 of 1 slices shown (8 of 8)]
[im 1/1]
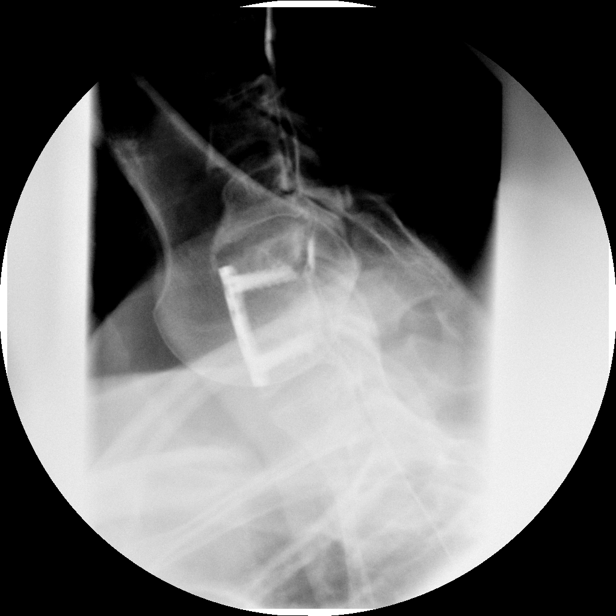

[Series 1001: view not recorded · 0.15mm/px · 1 of 1 slices shown (1 of 3)]
[im 1/1]
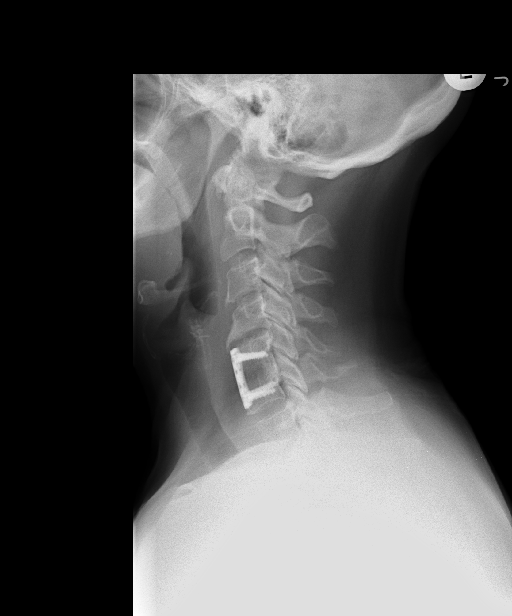

[Series 1002: view not recorded · 0.15mm/px · 1 of 1 slices shown (2 of 3)]
[im 1/1]
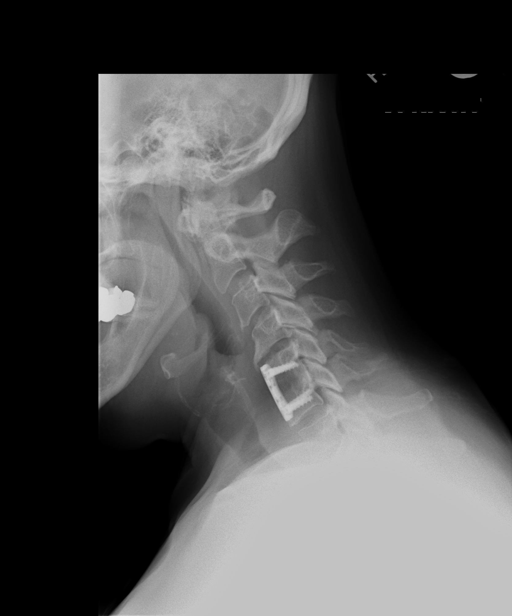

[Series 1003: view not recorded · 0.15mm/px · 1 of 1 slices shown (3 of 3)]
[im 1/1]
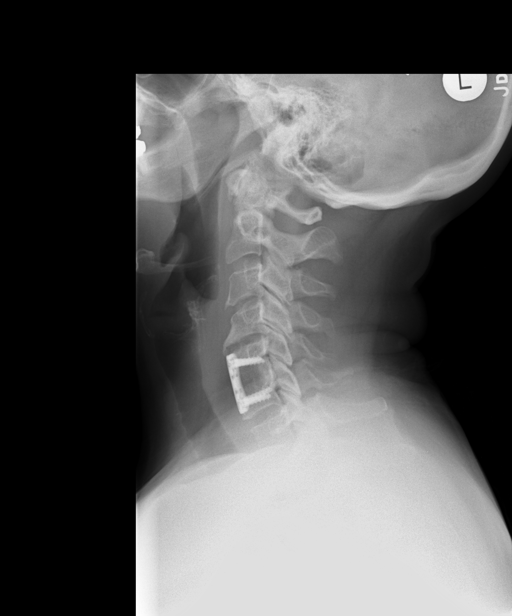

[13 of 13 positions shown; findings below may reference images not displayed]

FLUOROSCOPY TIME:  0 min 59 seconds

PROCEDURE:
LUMBAR PUNCTURE FOR CERVICAL MYELOGRAM

After thorough discussion of risks and benefits of the procedure
including bleeding, infection, injury to nerves, blood vessels,
adjacent structures as well as headache and CSF leak, written and
oral informed consent was obtained. Consent was obtained by Dr.
Levington Evas. We discussed the high likelihood of obtaining a
diagnostic study.

Patient was positioned prone on the fluoroscopy table. Local
anesthesia was provided with 1% lidocaine without epinephrine after
prepped and draped in the usual sterile fashion. Puncture was
performed at L3-L4 using a 3 1/2 inch 22 gauge pencil point Kir[place]oya
spinal needle via RIGHT paramedian approach. Using a single pass
through the dura, the needle was placed within the thecal sac, with
return of clear CSF. 10 mL of 8mnipaque-G44 was injected into the
thecal sac, with normal opacification of the nerve roots and cauda
equina consistent with free flow within the subarachnoid space. The
patient was then moved to the trendelenburg position and contrast
flowed into the Cervical spine region.

I personally performed the lumbar puncture and administered the
intrathecal contrast. I also personally supervised acquisition of
the myelogram images.
FINDINGS: CERVICAL MYELOGRAM FINDINGS:

Plain film myelogram shows solid fusion at C5-C6. Suboptimal
visualization of the lower cervical spine. Adjacent segment disease
is difficult to assess by plain film. There is no pathologic motion
with flexion extension. Range of motion is very limited.

CT CERVICAL MYELOGRAM FINDINGS:

Alignment: Anatomic.

Vertebrae: Degenerative endplate changes with subchondral cyst at
C3-C4.

Cord: Normal caliber.

Posterior Fossa: Grossly normal.

Vertebral Arteries: Grossly normal.

Paraspinal tissues: Lung apices appear within normal limits.

Disc levels:

C2-C3: Negative. Facet joints appear normal. Normal disc. No
stenosis.

C3-C4: Mild disc degeneration with anterior annular calcification.
Mild RIGHT uncovertebral spurring. The LEFT neural foramen appears
patent. Minimal RIGHT foraminal encroachment.

C4-C5: Adjacent segment disease is present with mild central
stenosis. There is a broad-based disc osteophyte complex with tip
effaces the ventral subarachnoid space and contacts the ventral cord
with flattening. There is bilateral uncovertebral spurring with mild
RIGHT-greater-than-LEFT foraminal stenosis.

C5-C6: ACDF.  Solid fusion.  No recurrent stenosis.

C6-C7: Evaluation degraded by artifact at this level. There is a
RIGHT eccentric central disc protrusion. Disc contacts the cord to
the RIGHT of midline and produces mild central stenosis. Neural
foramina appear adequately patent.

C7-T1: Normal appearance of the disc. Evaluation of this level is
degraded by beam attenuation from body habitus. No stenosis.
IMPRESSION: 1. Technically successful lumbar puncture for cervical myelogram.
2. Solid C5-C6 fusion without recurrent stenosis.
3. C4-C5 adjacent segment disease with shallow disc osteophyte
complex producing mild central stenosis with flattening of the cord.
Mild RIGHT foraminal stenosis.
4. C6-C7 evaluation is degraded by artifact from hardware. RIGHT
eccentric disc protrusion producing mild central stenosis.
